# Patient Record
Sex: Female | Born: 2005 | Race: Black or African American | Hispanic: No | Marital: Single | Smoking: Never smoker
Health system: Southern US, Community
[De-identification: ages and names within clinical notes are randomized; demographics above are authoritative.]

## PROBLEM LIST (undated history)

## (undated) DIAGNOSIS — J45909 Unspecified asthma, uncomplicated: Secondary | ICD-10-CM

## (undated) DIAGNOSIS — E669 Obesity, unspecified: Secondary | ICD-10-CM

## (undated) DIAGNOSIS — R569 Unspecified convulsions: Secondary | ICD-10-CM

## (undated) DIAGNOSIS — D573 Sickle-cell trait: Secondary | ICD-10-CM

## (undated) DIAGNOSIS — K219 Gastro-esophageal reflux disease without esophagitis: Secondary | ICD-10-CM

## (undated) DIAGNOSIS — J302 Other seasonal allergic rhinitis: Secondary | ICD-10-CM

## (undated) DIAGNOSIS — K5904 Chronic idiopathic constipation: Secondary | ICD-10-CM

## (undated) HISTORY — PX: ADENOIDECTOMY: SUR15

## (undated) HISTORY — DX: Unspecified convulsions: R56.9

## (undated) HISTORY — DX: Chronic idiopathic constipation: K59.04

## (undated) HISTORY — DX: Obesity, unspecified: E66.9

## (undated) HISTORY — DX: Unspecified asthma, uncomplicated: J45.909

## (undated) HISTORY — PX: TONSILLECTOMY: SUR1361

## (undated) HISTORY — DX: Sickle-cell trait: D57.3

---
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
## 2024-03-08 ENCOUNTER — Encounter (HOSPITAL_BASED_OUTPATIENT_CLINIC_OR_DEPARTMENT_OTHER): Payer: Self-pay

## 2024-03-08 ENCOUNTER — Other Ambulatory Visit: Payer: Self-pay

## 2024-03-08 ENCOUNTER — Emergency Department (HOSPITAL_BASED_OUTPATIENT_CLINIC_OR_DEPARTMENT_OTHER)
Admission: EM | Admit: 2024-03-08 | Discharge: 2024-03-08 | Disposition: A | Discharge status: Home / Self Care | Attending: Emergency Medicine | Admitting: Emergency Medicine

## 2024-03-08 DIAGNOSIS — M549 Dorsalgia, unspecified: Secondary | ICD-10-CM | POA: Insufficient documentation

## 2024-03-08 DIAGNOSIS — R1011 Right upper quadrant pain: Secondary | ICD-10-CM | POA: Diagnosis present

## 2024-03-08 DIAGNOSIS — R1013 Epigastric pain: Secondary | ICD-10-CM | POA: Insufficient documentation

## 2024-03-08 DIAGNOSIS — R110 Nausea: Secondary | ICD-10-CM | POA: Diagnosis not present

## 2024-03-08 DIAGNOSIS — R1012 Left upper quadrant pain: Secondary | ICD-10-CM | POA: Insufficient documentation

## 2024-03-08 LAB — COMPREHENSIVE METABOLIC PANEL WITH GFR
ALT: 6 U/L (ref 0–44)
AST: 16 U/L (ref 15–41)
Albumin: 4.6 g/dL (ref 3.5–5.0)
Alkaline Phosphatase: 94 U/L (ref 38–126)
Anion gap: 11 (ref 5–15)
BUN: 6 mg/dL (ref 6–20)
CO2: 23 mmol/L (ref 22–32)
Calcium: 9.7 mg/dL (ref 8.9–10.3)
Chloride: 104 mmol/L (ref 98–111)
Creatinine, Ser: 0.84 mg/dL (ref 0.44–1.00)
GFR, Estimated: >60 mL/min (ref >60)
Glucose, Bld: 87 mg/dL (ref 70–99)
Potassium: 4.1 mmol/L (ref 3.5–5.1)
Sodium: 139 mmol/L (ref 135–145)
Total Bilirubin: 1.5 mg/dL — ABNORMAL HIGH (ref 0.0–1.2)
Total Protein: 7.7 g/dL (ref 6.5–8.1)

## 2024-03-08 LAB — CBC
HCT: 39.8 % (ref 36.0–46.0)
Hemoglobin: 13.6 g/dL (ref 12.0–15.0)
MCH: 28.9 pg (ref 26.0–34.0)
MCHC: 34.2 g/dL (ref 30.0–36.0)
MCV: 84.5 fL (ref 80.0–100.0)
Platelets: 290 K/uL (ref 150–400)
RBC: 4.71 MIL/uL (ref 3.87–5.11)
RDW: 12.4 % (ref 11.5–15.5)
WBC: 7.5 K/uL (ref 4.0–10.5)
nRBC: 0 % (ref 0.0–0.2)

## 2024-03-08 LAB — URINALYSIS, ROUTINE W REFLEX MICROSCOPIC
Bilirubin Urine: NEGATIVE
Glucose, UA: NEGATIVE mg/dL
Hgb urine dipstick: NEGATIVE
Ketones, ur: NEGATIVE mg/dL
Leukocytes,Ua: NEGATIVE
Nitrite: NEGATIVE
Protein, ur: NEGATIVE mg/dL
Specific Gravity, Urine: 1.013 (ref 1.005–1.030)
pH: 7 (ref 5.0–8.0)

## 2024-03-08 LAB — LIPASE, BLOOD: Lipase: 27 U/L (ref 11–51)

## 2024-03-08 LAB — PREGNANCY, URINE: Preg Test, Ur: NEGATIVE

## 2024-03-08 MED ORDER — ONDANSETRON HCL 4 MG/2ML IJ SOLN
4.0000 mg | Freq: Once | INTRAMUSCULAR | Status: DC
  Filled 2024-03-08: qty 2

## 2024-03-08 MED ORDER — DICYCLOMINE HCL 20 MG PO TABS: 20.0000 mg | ORAL_TABLET | ORAL | 0 refills | Status: AC | PRN

## 2024-03-08 MED ORDER — ONDANSETRON HCL 4 MG PO TABS: 4.0000 mg | ORAL_TABLET | Freq: Three times a day (TID) | ORAL | 0 refills | Status: AC | PRN

## 2024-03-08 MED ORDER — DICYCLOMINE HCL 10 MG PO CAPS
10.0000 mg | ORAL_CAPSULE | Freq: Once | ORAL | Status: AC
  Administered 2024-03-08: 10 mg via ORAL
  Filled 2024-03-08: qty 1

## 2024-03-08 MED ORDER — ONDANSETRON 4 MG PO TBDP
4.0000 mg | ORAL_TABLET | Freq: Once | ORAL | Status: AC
  Administered 2024-03-08: 4 mg via ORAL
  Filled 2024-03-08: qty 1

## 2024-03-08 NOTE — ED Triage Notes (Signed)
 Arrives ambulatory to the ED with complaints of increased right side abdominal pain x5 days. Patient states that she is now having back pain as well. Rates pain an 8/10.

## 2024-03-08 NOTE — ED Provider Notes (Signed)
 [place] EMERGENCY DEPARTMENT AT [facility] Provider Note   CSN: [id number] Arrival date & time: 03/08/24  1646     Patient presents with: Abdominal Pain and Back Pain   Laura [name] is a 18 y.o. female.  Patient is here for evaluation of right upper quadrant abdominal pain that began 3 days ago.  She denies any associated fevers.  She has had her gallbladder removed in May 2025.  Last bowel movement was yesterday and hard, as she had not had a bowel movement for about 2 days and she typically goes daily.  She denies any blood in stool or dark-colored stool.  She denies dysuria or hematuria.  She states the pain came on suddenly while she was sitting watching TV.  She denies strenuous exercise or lifting heavy weights, denies traumatic injury.  The pain is described as a sharp stabbing pain that radiates into her back.  Reports associated nausea without any episodes of emesis.  Patient reports she took her GERD medication without improvement of symptoms.  She denies chest pain, shortness of breath, dizziness, or syncope.  The history is provided by the patient.  Abdominal Pain Pain location:  RUQ, epigastric and LLQ Back Pain Associated symptoms: abdominal pain        Prior to Admission medications   Medication Sig Start Date End Date Taking? Authorizing Provider  albuterol  (VENTOLIN  HFA) 108 (90 Base) MCG/ACT inhaler Inhale 2 puffs into the lungs every 6 (six) hours as needed for wheezing or shortness of breath. Use with spacer chamber 11/18/19 11/15/20  Klett, Macario HERO, NP  Azelastine HCl 137 MCG/SPRAY SOLN Place 1 spray into both nostrils 2 (two) times daily. 12/11/20   [provider]  famotidine  (PEPCID ) 20 MG tablet Take 1 tablet (20 mg total) by mouth 2 (two) times daily. 03/30/20   Klett, Macario HERO, NP  ferrous sulfate  300 (60 Fe) MG/5ML syrup Take by mouth.    [provider]  fluticasone  (FLONASE ) 50 MCG/ACT nasal spray USE 2 SPRAYS IN EACH NOSTRIL OF  THE NOSE ONCE DAILY 11/18/19 11/17/20  Klett, Macario HERO, NP  montelukast  (SINGULAIR ) 10 MG tablet Take 1 tablet (10 mg total) by mouth at bedtime. 04/01/19 03/31/20  Belenda Macario HERO, NP  riboflavin (VITAMIN B-2) 100 MG TABS tablet Take 1 tablet (100 mg total) by mouth daily. 12/19/17   Corinthia Blossom, MD  Spacer/Aero-Holding Chambers (AEROCHAMBER MAX WITH MASK- MEDIUM) inhaler 1 each by Other route once. Use as instructed    [provider]  topiramate  (TOPAMAX ) 25 MG tablet Take 1 tablet in a.m. and 2 tablets in p.m. p.o. 02/27/18   Corinthia Blossom, MD  topiramate  (TOPAMAX ) 25 MG tablet Take by mouth.    [provider]    Allergies: Other and Peach flavoring agent (non-screening)    Review of Systems  Gastrointestinal:  Positive for abdominal pain.  Musculoskeletal:  Positive for back pain.    Updated Vital Signs BP (!) 115/57   Pulse 74   Temp 99 F (37.2 C) (Oral)   Resp 16   Ht 4' 11 (1.499 m)   Wt 91.6 kg   SpO2 100%   BMI 40.80 kg/m   Physical Exam Vitals and nursing note reviewed.  Constitutional:      General: She is not in acute distress.    Appearance: She is well-developed. She is not ill-appearing or toxic-appearing.  HENT:     Head: Normocephalic and atraumatic.  Eyes:     General: No  scleral icterus.    Extraocular Movements: Extraocular movements intact.  Cardiovascular:     Rate and Rhythm: Normal rate and regular rhythm.     Heart sounds: Normal heart sounds.  Pulmonary:     Effort: Pulmonary effort is normal. No respiratory distress.     Breath sounds: No stridor. No wheezing, rhonchi or rales.  Abdominal:     General: Abdomen is flat. Bowel sounds are normal. There is no distension. There are no signs of injury.     Palpations: Abdomen is soft.     Tenderness: There is abdominal tenderness in the right upper quadrant, epigastric area and left upper quadrant. There is no right CVA tenderness, left CVA tenderness, guarding or rebound. Negative  signs include Murphy's sign, Rovsing's sign and McBurney's sign.  Skin:    Capillary Refill: Capillary refill takes less than 2 seconds.  Neurological:     Mental Status: She is alert and oriented to person, place, and time.     (all labs ordered are listed, but only abnormal results are displayed) Labs Reviewed  COMPREHENSIVE METABOLIC PANEL WITH GFR - Abnormal; Notable for the following components:      Result Value   Total Bilirubin 1.5 (*)    All other components within normal limits  URINALYSIS, ROUTINE W REFLEX MICROSCOPIC - Abnormal; Notable for the following components:   Bacteria, UA RARE (*)    All other components within normal limits  LIPASE, BLOOD  CBC  PREGNANCY, URINE    EKG: None  Radiology: No results found.   Procedures   Medications Ordered in the ED  ondansetron  (ZOFRAN -ODT) disintegrating tablet 4 mg (4 mg Oral Given 03/08/24 2132)  dicyclomine  (BENTYL ) capsule 10 mg (10 mg Oral Given 03/08/24 2132)    Patient presents to the ED for concern of abdominal pain, this involves an extensive number of treatment options, and is a complaint that carries with it a high risk of complications and morbidity.  The differential diagnosis includes, MI, PE, bowel obstruction, gastroenteritis, GERD.  Based on patient age, vitals, and symptoms there is low suspicion for MI or PE.  Last bowel movement yesterday so low suspicion for bowel obstruction.  Patient reports no improvement with GERD medication.   Co morbidities that complicate the patient evaluation  History of GERD Cholecystectomy   Additional history obtained:  [facility] history obtained [facility]  [facility]   External records from outside source obtained and reviewed including history also provided by mother   Lab Tests:  I Ordered, and personally interpreted labs.  The pertinent results include: Blood work is unremarkable.   Medicines ordered and prescription drug management:  I ordered medication  including Bentyl  and Zofran  for abdominal pain and nausea Reevaluation of the patient after these medicines showed that the patient improved I have reviewed the patients home medicines and have made adjustments as needed   Test Considered:  CT abdomen pelvis: Shared decision making with patient and her mother to not have imaging performed at this time as symptoms improved with given medications.   Problem List / ED Course:  Patient is here today for evaluation of abdominal pain.  Lab work and urinalysis are unremarkable.  Patient was offered CT imaging and medications for symptom management.  Shared decision making with patient and her mother to try medications to see if they improve her symptoms prior to performing CT imaging.  Medications did improve her symptoms so patient and her mother do not feel CT imaging is warranted at this  time.  I agree that we can forego imaging at this time and they will return if any concerning symptoms develop.   Reevaluation:  After the interventions noted above, I reevaluated the patient and found that they have :improved   Dispostion:  After consideration of the diagnostic results and the patients response to treatment, I feel that the patent would benefit from supportive care in the home setting and sent medications as needed if symptoms return.  Follow-up with primary care for evaluation of improvement of symptoms and ongoing care.  She will return if any concerning symptoms develop.   Medical Decision Making Amount and/or Complexity of Data Reviewed Labs: ordered.     Final diagnoses:  None    ED Discharge Orders     None          Rosina Almarie DELENA DEVONNA 03/08/24 2330    Randol Simmonds, MD 03/12/24 [phone]

## 2024-03-08 NOTE — Discharge Instructions (Addendum)
 I have sent the same medications you took today that improved your symptoms to your pharmacy.  Follow-up with your primary care physician for further evaluation improvement of symptoms and ongoing management.    Please return if you develop fevers, persistent vomiting, pain that does not resolve with these medications, blood in stool, or any other concerning symptoms.

## 2024-03-09 ENCOUNTER — Emergency Department (HOSPITAL_BASED_OUTPATIENT_CLINIC_OR_DEPARTMENT_OTHER)
Admission: EM | Admit: 2024-03-09 | Discharge: 2024-03-09 | Disposition: A | Discharge status: Home / Self Care | Attending: Emergency Medicine | Admitting: Emergency Medicine

## 2024-03-09 ENCOUNTER — Emergency Department (HOSPITAL_BASED_OUTPATIENT_CLINIC_OR_DEPARTMENT_OTHER)

## 2024-03-09 ENCOUNTER — Other Ambulatory Visit: Payer: Self-pay

## 2024-03-09 DIAGNOSIS — K529 Noninfective gastroenteritis and colitis, unspecified: Secondary | ICD-10-CM | POA: Insufficient documentation

## 2024-03-09 DIAGNOSIS — J45909 Unspecified asthma, uncomplicated: Secondary | ICD-10-CM | POA: Insufficient documentation

## 2024-03-09 DIAGNOSIS — R1013 Epigastric pain: Secondary | ICD-10-CM | POA: Diagnosis present

## 2024-03-09 LAB — CBC WITH DIFFERENTIAL/PLATELET
Abs Immature Granulocytes: 0.02 K/uL (ref 0.00–0.07)
Basophils Absolute: 0 K/uL (ref 0.0–0.1)
Basophils Relative: 0 %
Eosinophils Absolute: 0.2 K/uL (ref 0.0–0.5)
Eosinophils Relative: 2 %
HCT: 39 % (ref 36.0–46.0)
Hemoglobin: 13.2 g/dL (ref 12.0–15.0)
Immature Granulocytes: 0 %
Lymphocytes Relative: 28 %
Lymphs Abs: 2.1 K/uL (ref 0.7–4.0)
MCH: 28.6 pg (ref 26.0–34.0)
MCHC: 33.8 g/dL (ref 30.0–36.0)
MCV: 84.6 fL (ref 80.0–100.0)
Monocytes Absolute: 0.3 K/uL (ref 0.1–1.0)
Monocytes Relative: 4 %
Neutro Abs: 5 K/uL (ref 1.7–7.7)
Neutrophils Relative %: 66 %
Platelets: 289 K/uL (ref 150–400)
RBC: 4.61 MIL/uL (ref 3.87–5.11)
RDW: 12.4 % (ref 11.5–15.5)
WBC: 7.6 K/uL (ref 4.0–10.5)
nRBC: 0 % (ref 0.0–0.2)

## 2024-03-09 LAB — COMPREHENSIVE METABOLIC PANEL WITH GFR
ALT: 7 U/L (ref 0–44)
AST: 20 U/L (ref 15–41)
Albumin: 4.5 g/dL (ref 3.5–5.0)
Alkaline Phosphatase: 89 U/L (ref 38–126)
Anion gap: 11 (ref 5–15)
BUN: 9 mg/dL (ref 6–20)
CO2: 22 mmol/L (ref 22–32)
Calcium: 9.8 mg/dL (ref 8.9–10.3)
Chloride: 104 mmol/L (ref 98–111)
Creatinine, Ser: 0.93 mg/dL (ref 0.44–1.00)
GFR, Estimated: >60 mL/min (ref >60)
Glucose, Bld: 87 mg/dL (ref 70–99)
Potassium: 4.7 mmol/L (ref 3.5–5.1)
Sodium: 137 mmol/L (ref 135–145)
Total Bilirubin: 1 mg/dL (ref 0.0–1.2)
Total Protein: 7.6 g/dL (ref 6.5–8.1)

## 2024-03-09 LAB — URINALYSIS, ROUTINE W REFLEX MICROSCOPIC
Bilirubin Urine: NEGATIVE
Glucose, UA: NEGATIVE mg/dL
Hgb urine dipstick: NEGATIVE
Ketones, ur: NEGATIVE mg/dL
Leukocytes,Ua: NEGATIVE
Nitrite: NEGATIVE
Protein, ur: NEGATIVE mg/dL
Specific Gravity, Urine: 1.013 (ref 1.005–1.030)
pH: 5.5 (ref 5.0–8.0)

## 2024-03-09 LAB — PREGNANCY, URINE: Preg Test, Ur: NEGATIVE

## 2024-03-09 MED ORDER — ONDANSETRON HCL 4 MG/2ML IJ SOLN
4.0000 mg | Freq: Once | INTRAMUSCULAR | Status: AC
  Administered 2024-03-09: 4 mg via INTRAVENOUS
  Filled 2024-03-09: qty 2

## 2024-03-09 MED ORDER — IOHEXOL 300 MG/ML  SOLN
100.0000 mL | Freq: Once | INTRAMUSCULAR | Status: AC | PRN
  Administered 2024-03-09: 100 mL via INTRAVENOUS

## 2024-03-09 MED ORDER — MORPHINE SULFATE (PF) 4 MG/ML IV SOLN
4.0000 mg | Freq: Once | INTRAVENOUS | Status: AC
  Administered 2024-03-09: 4 mg via INTRAVENOUS
  Filled 2024-03-09: qty 1

## 2024-03-09 MED ORDER — SODIUM CHLORIDE 0.9 % IV BOLUS
1000.0000 mL | Freq: Once | INTRAVENOUS | Status: AC
  Administered 2024-03-09: 1000 mL via INTRAVENOUS

## 2024-03-09 NOTE — ED Triage Notes (Signed)
 Reports abd pain with bladder pain and urinary frequency. Seen here for same yesterday.

## 2024-03-09 NOTE — ED Notes (Signed)

## 2024-03-09 NOTE — Discharge Instructions (Addendum)
 As we discussed, your workup in the ER today was overall reassuring for acute findings.  Did look like you had some inflammation in your intestine and the area that you are having pain.  This should resolve on its own.  Please continue to take the Zofran  and Bentyl  you were prescribed yesterday, make sure you are staying good and hydrated and call your surgical team as well as your primary care provider to schedule a close follow-up appointment.  Return if development of any new or worsening symptoms.

## 2024-03-09 NOTE — ED Provider Notes (Signed)
 [place] EMERGENCY DEPARTMENT AT [facility] Provider Note   CSN: [id number] Arrival date & time: 03/09/24  1151     Patient presents with: Abdominal Pain   Laura [name] is a 18 y.o. female.   Patient with history of sickle cell trait, asthma, seizures presents today with complaints of abdominal pain.  Reports that same began 4 days ago, has in the right upper quadrant and epigastric region.  History of cholecystectomy in May 2025.  Denies nausea, vomiting, or diarrhea.  No hematuria or dysuria.  Is having regular bowel movements.  Denies any vaginal discharge or concern for STDs.  She was here for similar symptoms yesterday and was given Zofran  and Bentyl  which she has been taking with some improvement, however is continuing to have some pain.  She was offered a CT scan yesterday and declined this, presents today for imaging.  The history is provided by the patient. No language interpreter was used.  Abdominal Pain      Prior to Admission medications   Medication Sig Start Date End Date Taking? Authorizing Provider  albuterol  (VENTOLIN  HFA) 108 (90 Base) MCG/ACT inhaler Inhale 2 puffs into the lungs every 6 (six) hours as needed for wheezing or shortness of breath. Use with spacer chamber 11/18/19 11/15/20  Klett, Macario HERO, NP  Azelastine HCl 137 MCG/SPRAY SOLN Place 1 spray into both nostrils 2 (two) times daily. 12/11/20   [provider]  dicyclomine  (BENTYL ) 20 MG tablet Take 1 tablet (20 mg total) by mouth as needed for spasms (No more than twice daily.). 03/08/24   Rosina Almarie LABOR, PA-C  famotidine  (PEPCID ) 20 MG tablet Take 1 tablet (20 mg total) by mouth 2 (two) times daily. 03/30/20   Klett, Macario HERO, NP  ferrous sulfate  300 (60 Fe) MG/5ML syrup Take by mouth.    [provider]  fluticasone  (FLONASE ) 50 MCG/ACT nasal spray USE 2 SPRAYS IN EACH NOSTRIL OF THE NOSE ONCE DAILY 11/18/19 11/17/20  Klett, Macario HERO, NP  montelukast  (SINGULAIR ) 10 MG tablet Take 1  tablet (10 mg total) by mouth at bedtime. 04/01/19 03/31/20  Belenda Macario HERO, NP  ondansetron  (ZOFRAN ) 4 MG tablet Take 1 tablet (4 mg total) by mouth every 8 (eight) hours as needed for nausea or vomiting. 03/08/24   Rosina Almarie LABOR, PA-C  riboflavin (VITAMIN B-2) 100 MG TABS tablet Take 1 tablet (100 mg total) by mouth daily. 12/19/17   Corinthia Blossom, MD  Spacer/Aero-Holding Chambers (AEROCHAMBER MAX WITH MASK- MEDIUM) inhaler 1 each by Other route once. Use as instructed    [provider]  topiramate  (TOPAMAX ) 25 MG tablet Take 1 tablet in a.m. and 2 tablets in p.m. p.o. 02/27/18   Corinthia Blossom, MD  topiramate  (TOPAMAX ) 25 MG tablet Take by mouth.    [provider]    Allergies: Other and Peach flavoring agent (non-screening)    Review of Systems  Gastrointestinal:  Positive for abdominal pain.  All other systems reviewed and are negative.   Updated Vital Signs BP 123/89   Pulse 74   Temp 98.1 F (36.7 C)   Resp 16   SpO2 97%   Physical Exam Vitals and nursing note reviewed.  Constitutional:      General: She is not in acute distress.    Appearance: Normal appearance. She is normal weight. She is not ill-appearing, toxic-appearing or diaphoretic.  HENT:     Head: Normocephalic and atraumatic.  Cardiovascular:     Rate and Rhythm: Normal  rate.  Pulmonary:     Effort: Pulmonary effort is normal. No respiratory distress.  Abdominal:     General: Abdomen is flat.     Palpations: Abdomen is soft.     Tenderness: There is generalized abdominal tenderness and tenderness in the right upper quadrant and epigastric area. There is no guarding or rebound.  Musculoskeletal:        General: Normal range of motion.     Cervical back: Normal range of motion.  Skin:    General: Skin is warm and dry.  Neurological:     General: No focal deficit present.     Mental Status: She is alert.  Psychiatric:        Mood and Affect: Mood normal.        Behavior:  Behavior normal.     (all labs ordered are listed, but only abnormal results are displayed) Labs Reviewed  URINALYSIS, ROUTINE W REFLEX MICROSCOPIC  PREGNANCY, URINE  CBC WITH DIFFERENTIAL/PLATELET  COMPREHENSIVE METABOLIC PANEL WITH GFR    EKG: None  Radiology: CT ABDOMEN PELVIS W CONTRAST Result Date: 03/09/2024 CLINICAL DATA:  Acute abdominal pain, bladder pain, urinary frequency EXAM: CT ABDOMEN AND PELVIS WITH CONTRAST TECHNIQUE: Multidetector CT imaging of the abdomen and pelvis was performed using the standard protocol following bolus administration of intravenous contrast. RADIATION DOSE REDUCTION: This exam was performed according to the departmental dose-optimization program which includes automated exposure control, adjustment of the mA and/or kV according to patient size and/or use of iterative reconstruction technique. CONTRAST:  OMNIPAQUE  IOHEXOL  300 MG/ML  SOLN COMPARISON:  05/07/2022, 09/03/2023 FINDINGS: Lower chest: No acute pleural or parenchymal lung disease. Stable right middle lobe scarring. Hepatobiliary: No focal liver abnormality is seen. Status post cholecystectomy. No biliary dilatation. Pancreas: Unremarkable. No pancreatic ductal dilatation or surrounding inflammatory changes. Spleen: Normal in size without focal abnormality. Adrenals/Urinary Tract: Adrenal glands are unremarkable. Kidneys are normal, without renal calculi, focal lesion, or hydronephrosis. Bladder is unremarkable. Stomach/Bowel: No bowel obstruction or ileus. Normal retrocecal appendix. There is minimal pericolonic fat stranding at the hepatic flexure of the colon, but I do not see any associated bowel wall thickening. Please correlate with timing of interval cholecystectomy as this could reflect postoperative change. Mild colitis cannot be completely excluded. Vascular/Lymphatic: No significant vascular findings are present. No enlarged abdominal or pelvic lymph nodes. Reproductive: Uterus and  bilateral adnexa are unremarkable. Other: In addition to the right upper quadrant pericolonic fat stranding, there is trace fluid along the inferolateral margin of the liver. As discussed above, there has been an interval cholecystectomy since the 09/03/2023 hepatobiliary scan. Please correlate with timing of prior procedure as this could reflect residual postoperative changes. No rim enhancing fluid collection or abscess. Free fluid within the pelvis is likely physiologic. No free intraperitoneal gas. No abdominal wall hernia. Musculoskeletal: No acute or destructive bony abnormalities. Reconstructed images demonstrate no additional findings. IMPRESSION: 1. Nonspecific right upper quadrant pericolonic fat stranding and trace fluid along the inferolateral capsule of the liver. Please correlate with timing of interval cholecystectomy, as this could reflect residual postsurgical changes. No rim enhancing fluid collection or abscess is identified. Given proximity to the hepatic flexure of the colon, mild colitis cannot be excluded despite the lack of associated colonic wall thickening. 2. Pelvic free fluid, likely physiologic. 3. Normal appendix. Electronically Signed   By: Ozell Daring M.D.   On: 03/09/2024 15:37     Procedures   Medications Ordered in the ED  morphine  (PF)  4 MG/ML injection 4 mg (4 mg Intravenous Given 03/09/24 1333)  ondansetron  (ZOFRAN ) injection 4 mg (4 mg Intravenous Given 03/09/24 1333)  sodium chloride  0.9 % bolus 1,000 mL (1,000 mLs Intravenous New Bag/Given 03/09/24 1332)  iohexol  (OMNIPAQUE ) 300 MG/ML solution 100 mL (100 mLs Intravenous Contrast Given 03/09/24 1404)                                    Medical Decision Making Amount and/or Complexity of Data Reviewed Labs: ordered. Radiology: ordered.  Risk Prescription drug management.   This patient is a 18 y.o. female who presents to the ED for concern of abdominal pain, this involves an extensive number of  treatment options, and is a complaint that carries with it a high risk of complications and morbidity. The emergent differential diagnosis prior to evaluation includes, but is not limited to,  PUD, gastritis, pancreatitis, gastroparesis, malignancy, biliary disease, ACS, pericarditis, pneumonia, intestinal ischemia, esophageal rupture, hepatitis, pregnancy  This is not an exhaustive differential.   Past Medical History / Co-morbidities / Social History:  has a past medical history of Acid reflux, Asthma, Constipation - functional, Obesity, Seasonal allergies, Seizures (HCC), and Sickle cell trait.  Additional history: Chart reviewed. Pertinent results include: seen yesterday, had benign labs, no imaging, discharged with zofran  and bentyl   Physical Exam: Physical exam performed. The pertinent findings include: Generalized abdominal tenderness to palpation without rebound or guarding  Lab Tests: I ordered, and personally interpreted labs.  The pertinent results include: No acute laboratory abnormalities   Imaging Studies: I ordered imaging studies including CT abdomen pelvis. I independently visualized and interpreted imaging which showed   1. Nonspecific right upper quadrant pericolonic fat stranding and trace fluid along the inferolateral capsule of the liver. Please correlate with timing of interval cholecystectomy, as this could reflect residual postsurgical changes. No rim enhancing fluid collection or abscess is identified. Given proximity to the hepatic flexure of the colon, mild colitis cannot be excluded despite the lack of associated colonic wall thickening. 2. Pelvic free fluid, likely physiologic. 3. Normal appendix.  I agree with the radiologist interpretation.   Medications: I ordered medication including morphine , zofran , IV fluids  for pain, nausea, dehydration. Reevaluation of the patient after these medicines showed that the patient improved. I have reviewed the patients  home medicines and have made adjustments as needed.   Disposition: After consideration of the diagnostic results and the patients response to treatment, I feel that emergency department workup does not suggest an emergent condition requiring admission or immediate intervention beyond what has been performed at this time. The plan is: Discharge with close outpatient follow-up and return precautions.  Patient's workup is overall benign, does have a scan that shows colitis versus postsurgical inflammation, cholecystectomy was in May 2025.  She has no leukocytosis and no significant rebound or guarding on exam she is tolerating secretions without nausea or vomiting.  Recommend supportive care, close outpatient surgical follow-up and return precautions. Evaluation and diagnostic testing in the emergency department does not suggest an emergent condition requiring admission or immediate intervention beyond what has been performed at this time.  Plan for discharge with close PCP follow-up.  Patient is understanding and amenable with plan, educated on red flag symptoms that would prompt immediate return.  Patient discharged in stable condition.   I discussed this case with my attending physician Dr. Pamella who cosigned this note including patient's presenting  symptoms, physical exam, and planned diagnostics and interventions. Attending physician stated agreement with plan or made changes to plan which were implemented.    Final diagnoses:  Colitis    ED Discharge Orders     None     An After Visit Summary was printed and given to the patient.      Laura Lauraine LABOR, PA-C 03/09/24 1740    Laura Ozell LABOR, DO 03/17/24 [phone]
